# Patient Record
Sex: Female | Born: 1955 | Race: Black or African American | Hispanic: No | Marital: Married | Smoking: Never smoker
Health system: Southern US, Community
[De-identification: ages and names within clinical notes are randomized; demographics above are authoritative.]

---

## 2008-08-05 ENCOUNTER — Emergency Department: Payer: Self-pay | Admitting: Emergency Medicine

## 2012-11-09 ENCOUNTER — Emergency Department (HOSPITAL_COMMUNITY)
Admission: EM | Admit: 2012-11-09 | Discharge: 2012-11-09 | Disposition: A | Discharge status: Home / Self Care | Payer: BC Managed Care – PPO | Attending: Emergency Medicine | Admitting: Emergency Medicine

## 2012-11-09 ENCOUNTER — Encounter (HOSPITAL_COMMUNITY): Payer: Self-pay | Admitting: *Deleted

## 2012-11-09 ENCOUNTER — Emergency Department (HOSPITAL_COMMUNITY): Payer: BC Managed Care – PPO

## 2012-11-09 DIAGNOSIS — M542 Cervicalgia: Secondary | ICD-10-CM | POA: Insufficient documentation

## 2012-11-09 DIAGNOSIS — Z79899 Other long term (current) drug therapy: Secondary | ICD-10-CM | POA: Insufficient documentation

## 2012-11-09 MED ORDER — OXYCODONE-ACETAMINOPHEN 5-325 MG PO TABS
1.0000 | ORAL_TABLET | Freq: Once | ORAL | Status: AC
  Administered 2012-11-09: 1 via ORAL
  Filled 2012-11-09: qty 1

## 2012-11-09 MED ORDER — OXYCODONE-ACETAMINOPHEN 5-325 MG PO TABS: 1.0000 | ORAL_TABLET | ORAL | Status: DC | PRN

## 2012-11-09 NOTE — ED Notes (Signed)
Pt to ED c/o L neck pain x 1 week that has now moved to her L arm.  Leaning her head to the L increases her neck pain, but nothing increases/decreases arm pain.  Pt took 2 asa yesterday with relief.

## 2012-11-09 NOTE — ED Notes (Addendum)
Pt reports increased left neck pain that radiates down her left arm x 2 weeks. States that pain increases with certain neck positions. 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 Pt denies chest pain at this time. Denies nausea and vomiting with neck pain. Pt alert and oriented x 4, neuro intact. Denies headache or fever.

## 2012-11-09 NOTE — ED Provider Notes (Signed)
History     CSN: [id number]  Arrival date & time 11/09/12  1901   First MD Initiated Contact with Patient 11/09/12 1936      Chief Complaint  Patient presents with  . Arm Pain     The history is provided by the patient.  neck pain Onset - 2 weeks ago Course - worsening Improved by - rest Worsened by - movement  Pt denies injury.  Reports left sided neck pain with pain radiating into left UE for 2 weeks. No fever/cp/sob.  No focal arm or leg weakness.  No arm numbness.  No urinary/fectal incontinence She has no h/o cancer, no h/o spinal surgery or known injury  PMH - none  History reviewed. No pertinent past surgical history.  No family history on file.  History  Substance Use Topics  . Smoking status: Never Smoker   . Smokeless tobacco: Not on file  . Alcohol Use: No    OB History   Grav Para Term Preterm Abortions TAB SAB Ect Mult Living                  Review of Systems  Constitutional: Negative for fever.  HENT: Positive for neck pain.   Respiratory: Negative for shortness of breath.   Cardiovascular: Negative for chest pain.  Gastrointestinal: Negative for nausea and vomiting.  Musculoskeletal: Negative for back pain.  Neurological: Negative for weakness.  All other systems reviewed and are negative.    Allergies  Review of patient's allergies indicates no known allergies.  Home Medications   Current Outpatient Rx  Name  Route  Sig  Dispense  Refill  . phentermine 37.5 MG capsule   Oral   Take 37.5 mg by mouth every morning.           BP 124/67  Pulse 88  Temp(Src) 98.7 F (37.1 C) (Oral)  Resp 18  Ht 5\' 5"  (1.651 m)  Wt 185 lb (83.915 kg)  BMI 30.79 kg/m2  SpO2 98%  Physical Exam CONSTITUTIONAL: Well developed/well nourished HEAD: Normocephalic/atraumatic EYES: EOMI/PERRL ENMT: Mucous membranes moist NECK: supple no meningeal signs SPINE:entire spine nontender, No bruising/crepitance/stepoffs noted to spine Left paraspinal  cervical tenderness CV: S1/S2 noted, no murmurs/rubs/gallops noted LUNGS: Lungs are clear to auscultation bilaterally, no apparent distress ABDOMEN: soft, nontender, no rebound or guarding GU:no cva tenderness NEURO: Pt is awake/alert, moves all extremitiesx4, no arm drift.   Equal power with hand grip, wrist flex/extension, elbow flex/extension, and equal power with shoulder abduction/adduction.  No focal sensory deficit is noted in either UE.   Equal biceps/brachioradial reflex in bilateral UE EXTREMITIES: pulses normal, full ROM, no tenderness or deformity SKIN: warm, color normal PSYCH: no abnormalities of mood noted  ED Course  Procedures   Pt has no cp/sob.  I doubt cardiac as cause.  She could have cervical radiculopathy Xray ordered   9:47 PM Pt well appearing, no distress Referred to nsgy We discussed strict return precautions MDM  Nursing notes including past medical history and social history reviewed and considered in documentation xrays reviewed and considered        Date: 11/09/2012  Rate: 79  Rhythm: normal sinus rhythm  QRS Axis: normal  Intervals: normal  ST/T Wave abnormalities: nonspecific ST changes  Conduction Disutrbances:none  Narrative Interpretation:   Old EKG Reviewed: none available at time of interpretation    Joya Gaskins, MD 11/09/12 2148

## 2015-07-03 ENCOUNTER — Emergency Department (HOSPITAL_COMMUNITY): Payer: No Typology Code available for payment source

## 2015-07-03 ENCOUNTER — Emergency Department (HOSPITAL_COMMUNITY)
Admission: EM | Admit: 2015-07-03 | Discharge: 2015-07-03 | Disposition: A | Discharge status: Home / Self Care | Payer: No Typology Code available for payment source | Attending: Emergency Medicine | Admitting: Emergency Medicine

## 2015-07-03 ENCOUNTER — Encounter (HOSPITAL_COMMUNITY): Payer: Self-pay | Admitting: Emergency Medicine

## 2015-07-03 DIAGNOSIS — Y998 Other external cause status: Secondary | ICD-10-CM | POA: Diagnosis not present

## 2015-07-03 DIAGNOSIS — Y9389 Activity, other specified: Secondary | ICD-10-CM | POA: Insufficient documentation

## 2015-07-03 DIAGNOSIS — S3992XA Unspecified injury of lower back, initial encounter: Secondary | ICD-10-CM | POA: Insufficient documentation

## 2015-07-03 DIAGNOSIS — Y92481 Parking lot as the place of occurrence of the external cause: Secondary | ICD-10-CM | POA: Diagnosis not present

## 2015-07-03 DIAGNOSIS — S134XXA Sprain of ligaments of cervical spine, initial encounter: Secondary | ICD-10-CM | POA: Diagnosis not present

## 2015-07-03 DIAGNOSIS — M545 Low back pain, unspecified: Secondary | ICD-10-CM

## 2015-07-03 DIAGNOSIS — R05 Cough: Secondary | ICD-10-CM | POA: Diagnosis not present

## 2015-07-03 DIAGNOSIS — S199XXA Unspecified injury of neck, initial encounter: Secondary | ICD-10-CM | POA: Diagnosis present

## 2015-07-03 MED ORDER — OXYCODONE-ACETAMINOPHEN 5-325 MG PO TABS: 1.0000 | ORAL_TABLET | ORAL | Status: AC | PRN

## 2015-07-03 MED ORDER — CYCLOBENZAPRINE HCL 5 MG PO TABS: 5.0000 mg | ORAL_TABLET | Freq: Two times a day (BID) | ORAL | Status: AC | PRN

## 2015-07-03 MED ORDER — OXYCODONE-ACETAMINOPHEN 5-325 MG PO TABS
1.0000 | ORAL_TABLET | Freq: Once | ORAL | Status: AC
  Administered 2015-07-03: 1 via ORAL
  Filled 2015-07-03: qty 1
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
## 2015-07-03 NOTE — Discharge Instructions (Signed)
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 position on your side. If your pain worsens with extension-based activities (prolonged standing, working overhead), avoid resting in an  extended position such as sleeping on your stomach. Most people will find more comfort when they rest with their spine in a more neutral position, neither too rounded nor too arched. Lying on a non-sagging bed on your side with a pillow between your knees, or on your back with a pillow under your knees will often provide some relief. Keep in mind, being in any one position for a prolonged period of time, no matter how correct your posture, can still lead to stiffness. PROPER SITTING POSTURE In order to minimize stress and discomfort on your spine, you must sit with correct posture. Sitting with good posture should be effortless for a healthy body. Returning to good posture is a gradual process. Many people can work toward this most comfortably by using various supports until they have the flexibility  and strength to maintain this posture on their own. When sitting with proper posture, your ears will fall over your shoulders and your shoulders will fall over your hips. You should use the back of the chair to support your upper back. Your lower back will be in a neutral position, just slightly arched. You may place a small pillow or folded towel at the base of your lower back for  support.  When working at a desk, create an environment that supports good, upright posture. Without extra support, muscles tire, which leads to excessive strain on joints and other tissues. Keep these recommendations in mind: CHAIR:  A chair should be able to slide under your desk when your back makes contact with the back of the chair. This allows you to work closely.  The chair's height should allow your eyes to be level with the upper part of your monitor and your hands to be slightly lower than your elbows. BODY POSITION  Your feet should make contact with the floor. If this is not possible, use a foot rest.  Keep your ears over your shoulders. This will reduce stress on your neck and low back. INCORRECT SITTING POSTURES  If you are feeling tired and unable to assume a healthy sitting posture, do not slouch or slump. This puts excessive strain on your back tissues, causing more damage and pain. Healthier options include:  Using more support, like a lumbar pillow.  Switching tasks to something that requires you to be upright or walking.  Talking a brief walk.  Lying down to rest in a neutral-spine position. PROLONGED STANDING WHILE SLIGHTLY LEANING FORWARD  When completing a task that requires you to lean forward while standing in one place for a long time, place either foot up on a stationary 2-4 inch high object to help maintain the best posture. When both feet are on the ground, the lower back tends to lose its slight inward curve. If this curve flattens (or becomes too large), then the back and your other  joints will experience too much stress, tire more quickly, and can cause pain. CORRECT STANDING POSTURES Proper standing posture should be assumed with all daily activities, even if they only take a few moments, like when brushing your teeth. As in sitting, your ears should fall over your shoulders and your shoulders should fall over your hips. You should keep a slight tension in your abdominal muscles to brace your spine. Your tailbone should point down to the ground, not behind your body, resulting in an over-extended swayback posture.  INCORRECT STANDING POSTURES  Common incorrect standing postures include a forward head, locked knees and/or an excessive swayback. WALKING Walk  with an upright posture. Your ears, shoulders and hips should all line-up. PROLONGED ACTIVITY IN A FLEXED POSITION When completing a task that requires you to bend forward at your waist or lean over a low surface, try to find a way to stabilize 3 out of 4 of your limbs. You can place a hand or elbow on your thigh or rest a knee on the surface you are reaching across. This will provide you more stability, so that your muscles do not tire as quickly. By keeping your knees relaxed, or slightly bent, you will also reduce stress across your lower back. CORRECT LIFTING TECHNIQUES DO :  Assume a wide stance. This will provide you more stability and the opportunity to get as close as possible to the object which you are lifting.  Tense your abdominals to brace your spine. Bend at the knees and hips. Keeping your back locked in a neutral-spine position, lift using your leg muscles. Lift with your legs, keeping your back straight.  Test the weight of unknown objects before attempting to lift them.  Try to keep your elbows locked down at your sides in order get the best strength from your shoulders when carrying an object.  Always ask for help when lifting heavy or awkward objects. INCORRECT LIFTING TECHNIQUES DO NOT:   Lock  your knees when lifting, even if it is a small object.  Bend and twist. Pivot at your feet or move your feet when needing to change directions.  Assume that you can safely pick up even a paperclip without proper posture.   This information is not intended to replace advice given to you by your health care provider. Make sure you discuss any questions you have with your health care provider.   Document Released: 08/04/2005 Document Revised: 08/25/2014 Document Reviewed: 11/16/2008 Elsevier Interactive Patient Education [employer]
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
## 2015-12-07 ENCOUNTER — Telehealth: Payer: Self-pay
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
## 2018-05-18 DEATH — deceased
